# Patient Record
Sex: Female | Born: 1975 | Hispanic: No | Marital: Married | Smoking: Never smoker
Health system: Southern US, Community
[De-identification: ages and names within clinical notes are randomized; demographics above are authoritative.]

---

## 2004-07-20 ENCOUNTER — Ambulatory Visit: Payer: Self-pay | Admitting: Urology

## 2004-10-13 ENCOUNTER — Inpatient Hospital Stay: Payer: Self-pay

## 2004-10-13 ENCOUNTER — Observation Stay: Payer: Self-pay

## 2012-03-13 ENCOUNTER — Ambulatory Visit: Payer: Self-pay | Admitting: Obstetrics and Gynecology

## 2014-12-23 ENCOUNTER — Other Ambulatory Visit: Payer: Self-pay | Admitting: Obstetrics and Gynecology

## 2014-12-23 DIAGNOSIS — Z1231 Encounter for screening mammogram for malignant neoplasm of breast: Secondary | ICD-10-CM

## 2015-01-01 ENCOUNTER — Ambulatory Visit
Admission: RE | Admit: 2015-01-01 | Discharge: 2015-01-01 | Disposition: A | Discharge status: Home / Self Care | Payer: BLUE CROSS/BLUE SHIELD | Source: Ambulatory Visit | Attending: Obstetrics and Gynecology | Admitting: Obstetrics and Gynecology

## 2015-01-01 DIAGNOSIS — Z1231 Encounter for screening mammogram for malignant neoplasm of breast: Secondary | ICD-10-CM | POA: Insufficient documentation

## 2015-12-07 ENCOUNTER — Other Ambulatory Visit: Payer: Self-pay | Admitting: Obstetrics and Gynecology

## 2015-12-07 DIAGNOSIS — Z1231 Encounter for screening mammogram for malignant neoplasm of breast: Secondary | ICD-10-CM

## 2016-01-17 ENCOUNTER — Ambulatory Visit
Admission: RE | Admit: 2016-01-17 | Discharge: 2016-01-17 | Disposition: A | Discharge status: Home / Self Care | Payer: Managed Care, Other (non HMO) | Source: Ambulatory Visit | Attending: Obstetrics and Gynecology | Admitting: Obstetrics and Gynecology

## 2016-01-17 DIAGNOSIS — Z1231 Encounter for screening mammogram for malignant neoplasm of breast: Secondary | ICD-10-CM | POA: Insufficient documentation

## 2016-12-14 ENCOUNTER — Other Ambulatory Visit: Payer: Self-pay | Admitting: Certified Nurse Midwife

## 2016-12-14 DIAGNOSIS — Z1231 Encounter for screening mammogram for malignant neoplasm of breast: Secondary | ICD-10-CM

## 2017-04-16 ENCOUNTER — Ambulatory Visit
Admission: RE | Admit: 2017-04-16 | Discharge: 2017-04-16 | Disposition: A | Discharge status: Home / Self Care | Payer: 59 | Source: Ambulatory Visit | Attending: Certified Nurse Midwife | Admitting: Certified Nurse Midwife

## 2017-04-16 DIAGNOSIS — Z1231 Encounter for screening mammogram for malignant neoplasm of breast: Secondary | ICD-10-CM

## 2017-11-25 ENCOUNTER — Other Ambulatory Visit: Payer: Self-pay

## 2017-11-25 ENCOUNTER — Emergency Department
Admission: EM | Admit: 2017-11-25 | Discharge: 2017-11-25 | Disposition: A | Discharge status: Home / Self Care | Payer: Commercial Managed Care - HMO | Attending: Emergency Medicine | Admitting: Emergency Medicine

## 2017-11-25 ENCOUNTER — Emergency Department: Payer: Commercial Managed Care - HMO

## 2017-11-25 DIAGNOSIS — O200 Threatened abortion: Secondary | ICD-10-CM | POA: Diagnosis not present

## 2017-11-25 DIAGNOSIS — Z3A01 Less than 8 weeks gestation of pregnancy: Secondary | ICD-10-CM | POA: Insufficient documentation

## 2017-11-25 DIAGNOSIS — O9989 Other specified diseases and conditions complicating pregnancy, childbirth and the puerperium: Secondary | ICD-10-CM | POA: Diagnosis present

## 2017-11-25 LAB — CBC WITH DIFFERENTIAL/PLATELET
ABS IMMATURE GRANULOCYTES: 0.02 10*3/uL (ref 0.00–0.07)
BASOS ABS: 0.1 10*3/uL (ref 0.0–0.1)
BASOS PCT: 1 %
Eosinophils Absolute: 0.3 10*3/uL (ref 0.0–0.5)
Eosinophils Relative: 4 %
HCT: 36.6 % (ref 36.0–46.0)
Hemoglobin: 12.8 g/dL (ref 12.0–15.0)
Immature Granulocytes: 0 %
Lymphocytes Relative: 26 %
Lymphs Abs: 1.9 10*3/uL (ref 0.7–4.0)
MCH: 29.2 pg (ref 26.0–34.0)
MCHC: 35 g/dL (ref 30.0–36.0)
MCV: 83.6 fL (ref 80.0–100.0)
MONO ABS: 0.5 10*3/uL (ref 0.1–1.0)
Monocytes Relative: 7 %
NEUTROS ABS: 4.8 10*3/uL (ref 1.7–7.7)
NRBC: 0 % (ref 0.0–0.2)
Neutrophils Relative %: 62 %
PLATELETS: 281 10*3/uL (ref 150–400)
RBC: 4.38 MIL/uL (ref 3.87–5.11)
RDW: 12.2 % (ref 11.5–15.5)
WBC: 7.6 10*3/uL (ref 4.0–10.5)

## 2017-11-25 LAB — BASIC METABOLIC PANEL
Anion gap: 10 (ref 5–15)
BUN: 13 mg/dL (ref 6–20)
CO2: 24 mmol/L (ref 22–32)
CREATININE: 0.55 mg/dL (ref 0.44–1.00)
Calcium: 9.3 mg/dL (ref 8.9–10.3)
Chloride: 103 mmol/L (ref 98–111)
GFR calc Af Amer: >60 mL/min (ref >60)
Glucose, Bld: 144 mg/dL — ABNORMAL HIGH (ref 70–99)
Potassium: 3.2 mmol/L — ABNORMAL LOW (ref 3.5–5.1)
Sodium: 137 mmol/L (ref 135–145)

## 2017-11-25 LAB — ABO/RH: ABO/RH(D): B POS

## 2017-11-25 LAB — HCG, QUANTITATIVE, PREGNANCY: HCG, BETA CHAIN, QUANT, S: 12947 m[IU]/mL — AB (ref <5)

## 2017-11-25 LAB — POCT PREGNANCY, URINE: Preg Test, Ur: POSITIVE — AB

## 2017-11-25 NOTE — ED Provider Notes (Signed)
[facility] Emergency Department Provider Note   ____________________________________________    I have reviewed the triage vital signs and the nursing notes.   HISTORY  Chief Complaint Pregnant vaginal bleeding    HPI Alexandra Greene is a 42 y.o. female who reports that she is about 6 to [redacted] weeks pregnant and is having lower abdominal discomfort as well as mild vaginal bleeding for approximately 1 week.  She denies nausea or vomiting.  Spoke to her doctor who recommended she be seen in the emergency department because of continued bleeding.  Did have a beta checked on Friday but does not know the results yet.  This is her second pregnancy, she has a 78 year old son.  History reviewed. No pertinent past medical history.  There are no active problems to display for this patient.   History reviewed. No pertinent surgical history.  Prior to Admission medications   Not on File     Allergies Patient has no known allergies.  Family History  Problem Relation Age of Onset  . Breast cancer Neg Hx     Social History Social History   Tobacco Use  . Smoking status: Never Smoker  . Smokeless tobacco: Never Used  Substance Use Topics  . Alcohol use: Never    Frequency: Never  . Drug use: Not on file    Review of Systems  Constitutional: No fever/chills Eyes: no redness ENT: No neck pain Cardiovascular: Denies chest pain. Respiratory: Denies shortness of breath. Gastrointestinal: No abdominal pain.   Genitourinary: As above Musculoskeletal: Negative for back pain. Skin: Negative for rash. Neurological: Negative for headache   ____________________________________________   PHYSICAL EXAM:  VITAL SIGNS: ED Triage Vitals  Enc Vitals Group     BP 11/25/17 1610 116/75     Pulse Rate 11/25/17 1610 94     Resp 11/25/17 1610 16     Temp 11/25/17 1610 98.5 F (36.9 C)     Temp Source 11/25/17 1610 Oral     SpO2 11/25/17 1610 100 %   Weight 11/25/17 1610 52.2 kg (115 lb)     Height 11/25/17 1610 1.549 m (5\' 1" )     Head Circumference --      Peak Flow --      Pain Score 11/25/17 1614 6     Pain Loc --      Pain Edu? --      Excl. in GC? --     Constitutional: Alert and oriented Eyes: Conjunctivae are normal.   Nose: No congestion/rhinnorhea. Mouth/Throat: Mucous membranes are moist.    Cardiovascular: Normal rate, regular rhythm. Good peripheral circulation. Respiratory: Normal respiratory effort.  No retractions.  Gastrointestinal:  No distention  Musculoskeletal:  Warm and well perfused Neurologic:  Normal speech and language. No gross focal neurologic deficits are appreciated.  Skin:  Skin is warm, dry and intact. No rash noted. Psychiatric: Mood and affect are normal. Speech and behavior are normal.  ____________________________________________   LABS (all labs ordered are listed, but only abnormal results are displayed)  Labs Reviewed  HCG, QUANTITATIVE, PREGNANCY - Abnormal; Notable for the following components:      Result Value   hCG, Beta Chain, Quant, S 12,947 (*)    All other components within normal limits  BASIC METABOLIC PANEL - Abnormal; Notable for the following components:   Potassium 3.2 (*)    Glucose, Bld 144 (*)    All other components within normal limits  POCT PREGNANCY, URINE -  Abnormal; Notable for the following components:   Preg Test, Ur POSITIVE (*)    All other components within normal limits  CBC WITH DIFFERENTIAL/PLATELET  POC URINE PREG, ED  ABO/RH   ____________________________________________  EKG  None ____________________________________________  RADIOLOGY  IUP, no cardiac activity concerning for nonviable pregnancy ____________________________________________   PROCEDURES  Procedure(s) performed: No  Procedures   Critical Care performed: No ____________________________________________   INITIAL IMPRESSION / ASSESSMENT AND PLAN / ED  COURSE  Pertinent labs & imaging results that were available during my care of the patient were reviewed by me and considered in my medical decision making (see chart for details).  Patient is Rh+.  Discussed ultrasound results with her, certainly concerning for possible failed pregnancy however not definitive.  She will need repeat ultrasound in 10 to 14 days, she will call her physician tomorrow.    ____________________________________________   FINAL CLINICAL IMPRESSION(S) / ED DIAGNOSES  Final diagnoses:  Threatened miscarriage        Note:  This document was prepared using Dragon voice recognition software and may include unintentional dictation errors.    Jene EveryKinner, Stevenson Windmiller, MD 11/25/17 2029

## 2017-11-25 NOTE — Discharge Instructions (Signed)
Your ultrasound did show an IUP but did not show cardiac activity yet, a repeat ultrasound is necessary in 10 to 14 days

## 2017-11-25 NOTE — ED Triage Notes (Signed)
Arrives with c/o vaginal bleeding x 1 week.  Today, states bleeding persists, but worsens and lower abdominal pain and lower back pain.  G2 P1.  LMP:  09/26/2017.  [facility]

## 2017-11-26 ENCOUNTER — Other Ambulatory Visit: Payer: Self-pay | Admitting: Family Medicine

## 2017-11-26 DIAGNOSIS — O209 Hemorrhage in early pregnancy, unspecified: Secondary | ICD-10-CM

## 2017-12-10 ENCOUNTER — Ambulatory Visit: Admission: RE | Admit: 2017-12-10 | Payer: Self-pay | Source: Ambulatory Visit

## 2018-04-23 ENCOUNTER — Other Ambulatory Visit: Payer: Self-pay | Admitting: Certified Nurse Midwife

## 2018-04-23 DIAGNOSIS — Z1231 Encounter for screening mammogram for malignant neoplasm of breast: Secondary | ICD-10-CM

## 2018-06-19 ENCOUNTER — Ambulatory Visit
Admission: RE | Admit: 2018-06-19 | Discharge: 2018-06-19 | Disposition: A | Discharge status: Home / Self Care | Payer: BC Managed Care – PPO | Source: Ambulatory Visit | Attending: Certified Nurse Midwife | Admitting: Certified Nurse Midwife

## 2018-06-19 ENCOUNTER — Other Ambulatory Visit: Payer: Self-pay

## 2018-06-19 DIAGNOSIS — Z1231 Encounter for screening mammogram for malignant neoplasm of breast: Secondary | ICD-10-CM | POA: Insufficient documentation

## 2018-06-20 ENCOUNTER — Other Ambulatory Visit: Payer: Self-pay | Admitting: Certified Nurse Midwife

## 2018-06-20 DIAGNOSIS — R928 Other abnormal and inconclusive findings on diagnostic imaging of breast: Secondary | ICD-10-CM

## 2018-06-20 DIAGNOSIS — N6310 Unspecified lump in the right breast, unspecified quadrant: Secondary | ICD-10-CM

## 2018-07-01 ENCOUNTER — Ambulatory Visit
Admission: RE | Admit: 2018-07-01 | Discharge: 2018-07-01 | Disposition: A | Discharge status: Home / Self Care | Payer: BC Managed Care – PPO | Source: Ambulatory Visit | Attending: Certified Nurse Midwife | Admitting: Certified Nurse Midwife

## 2018-07-01 ENCOUNTER — Other Ambulatory Visit: Payer: Self-pay

## 2018-07-01 DIAGNOSIS — R928 Other abnormal and inconclusive findings on diagnostic imaging of breast: Secondary | ICD-10-CM | POA: Diagnosis present

## 2018-07-01 DIAGNOSIS — N6310 Unspecified lump in the right breast, unspecified quadrant: Secondary | ICD-10-CM | POA: Diagnosis present

## 2018-07-08 ENCOUNTER — Ambulatory Visit: Payer: BC Managed Care – PPO

## 2019-05-27 ENCOUNTER — Other Ambulatory Visit: Payer: Self-pay | Admitting: Certified Nurse Midwife

## 2019-05-27 DIAGNOSIS — Z1231 Encounter for screening mammogram for malignant neoplasm of breast: Secondary | ICD-10-CM

## 2019-07-02 ENCOUNTER — Ambulatory Visit
Admission: RE | Admit: 2019-07-02 | Discharge: 2019-07-02 | Disposition: A | Discharge status: Home / Self Care | Payer: BC Managed Care – PPO | Source: Ambulatory Visit | Attending: Certified Nurse Midwife | Admitting: Certified Nurse Midwife

## 2019-07-02 DIAGNOSIS — Z1231 Encounter for screening mammogram for malignant neoplasm of breast: Secondary | ICD-10-CM | POA: Diagnosis not present

## 2019-07-08 ENCOUNTER — Other Ambulatory Visit: Payer: Self-pay | Admitting: Certified Nurse Midwife

## 2019-07-08 DIAGNOSIS — R928 Other abnormal and inconclusive findings on diagnostic imaging of breast: Secondary | ICD-10-CM

## 2019-07-09 ENCOUNTER — Other Ambulatory Visit: Payer: Self-pay | Admitting: Certified Nurse Midwife

## 2019-07-09 DIAGNOSIS — N6489 Other specified disorders of breast: Secondary | ICD-10-CM

## 2019-07-09 DIAGNOSIS — R928 Other abnormal and inconclusive findings on diagnostic imaging of breast: Secondary | ICD-10-CM

## 2019-07-28 ENCOUNTER — Ambulatory Visit
Admission: RE | Admit: 2019-07-28 | Discharge: 2019-07-28 | Disposition: A | Discharge status: Home / Self Care | Payer: BC Managed Care – PPO | Source: Ambulatory Visit | Attending: Certified Nurse Midwife | Admitting: Certified Nurse Midwife

## 2019-07-28 DIAGNOSIS — N6489 Other specified disorders of breast: Secondary | ICD-10-CM

## 2019-07-28 DIAGNOSIS — R928 Other abnormal and inconclusive findings on diagnostic imaging of breast: Secondary | ICD-10-CM

## 2020-06-30 ENCOUNTER — Other Ambulatory Visit: Payer: Self-pay | Admitting: Certified Nurse Midwife

## 2020-06-30 DIAGNOSIS — Z1231 Encounter for screening mammogram for malignant neoplasm of breast: Secondary | ICD-10-CM

## 2020-07-29 ENCOUNTER — Ambulatory Visit
Admission: RE | Admit: 2020-07-29 | Discharge: 2020-07-29 | Disposition: A | Discharge status: Home / Self Care | Payer: BC Managed Care – PPO | Source: Ambulatory Visit | Attending: Certified Nurse Midwife | Admitting: Certified Nurse Midwife

## 2020-07-29 ENCOUNTER — Other Ambulatory Visit: Payer: Self-pay

## 2020-07-29 DIAGNOSIS — Z1231 Encounter for screening mammogram for malignant neoplasm of breast: Secondary | ICD-10-CM | POA: Diagnosis present

## 2020-10-25 ENCOUNTER — Other Ambulatory Visit: Payer: Self-pay | Admitting: Certified Nurse Midwife

## 2020-10-25 DIAGNOSIS — N644 Mastodynia: Secondary | ICD-10-CM

## 2020-10-26 ENCOUNTER — Other Ambulatory Visit: Payer: Self-pay | Admitting: Certified Nurse Midwife

## 2020-10-26 DIAGNOSIS — N644 Mastodynia: Secondary | ICD-10-CM

## 2020-11-05 ENCOUNTER — Ambulatory Visit
Admission: RE | Admit: 2020-11-05 | Discharge: 2020-11-05 | Disposition: A | Discharge status: Home / Self Care | Payer: BC Managed Care – PPO | Source: Ambulatory Visit | Attending: Certified Nurse Midwife | Admitting: Certified Nurse Midwife

## 2020-11-05 ENCOUNTER — Other Ambulatory Visit: Payer: Self-pay

## 2020-11-05 DIAGNOSIS — N644 Mastodynia: Secondary | ICD-10-CM | POA: Diagnosis not present

## 2021-06-08 ENCOUNTER — Other Ambulatory Visit: Payer: Self-pay | Admitting: Certified Nurse Midwife

## 2021-06-08 DIAGNOSIS — Z1231 Encounter for screening mammogram for malignant neoplasm of breast: Secondary | ICD-10-CM

## 2021-08-01 ENCOUNTER — Ambulatory Visit
Admission: RE | Admit: 2021-08-01 | Discharge: 2021-08-01 | Disposition: A | Discharge status: Home / Self Care | Payer: BC Managed Care – PPO | Source: Ambulatory Visit | Attending: Certified Nurse Midwife | Admitting: Certified Nurse Midwife

## 2021-08-01 DIAGNOSIS — Z1231 Encounter for screening mammogram for malignant neoplasm of breast: Secondary | ICD-10-CM | POA: Insufficient documentation

## 2022-06-23 ENCOUNTER — Other Ambulatory Visit: Payer: Self-pay | Admitting: Internal Medicine

## 2022-06-23 DIAGNOSIS — Z1231 Encounter for screening mammogram for malignant neoplasm of breast: Secondary | ICD-10-CM

## 2022-08-09 ENCOUNTER — Ambulatory Visit
Admission: RE | Admit: 2022-08-09 | Discharge: 2022-08-09 | Disposition: A | Discharge status: Home / Self Care | Payer: 59 | Source: Ambulatory Visit | Attending: Internal Medicine | Admitting: Internal Medicine

## 2022-08-09 DIAGNOSIS — Z1231 Encounter for screening mammogram for malignant neoplasm of breast: Secondary | ICD-10-CM | POA: Diagnosis not present

## 2023-05-08 IMAGING — MG MM DIGITAL DIAGNOSTIC UNILAT*L* W/ TOMO W/ CAD
6 series · 6 of 18 positions shown · non-contrast
Comparison: Previous exams.

CLINICAL DATA: 45-year-old female with a focal area of intermittent
pain in the lower inner left breast.

EXAM:
DIGITAL DIAGNOSTIC UNILATERAL LEFT MAMMOGRAM WITH TOMOSYNTHESIS AND
CAD; ULTRASOUND LEFT BREAST LIMITED
TECHNIQUE: Left digital diagnostic mammography and breast tomosynthesis was
performed. The images were evaluated with computer-aided detection.;
Targeted ultrasound examination of the left breast was performed.

[L CC synth-2D (1 of 2)]
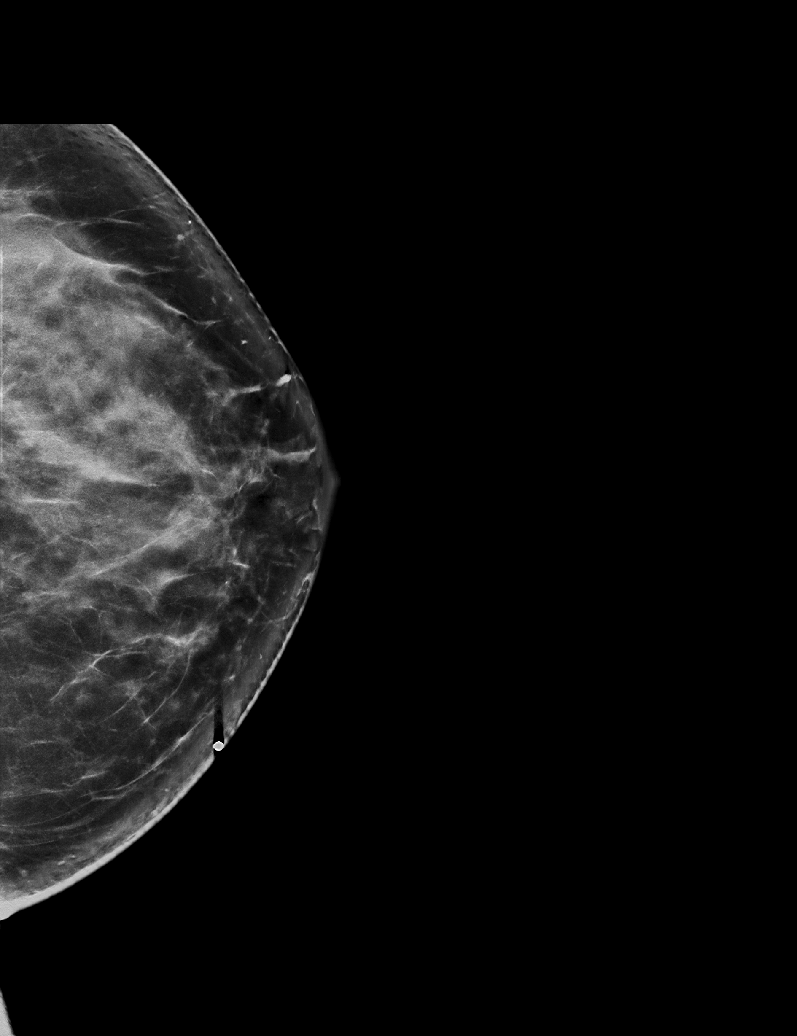

[L MLO synth-2D]
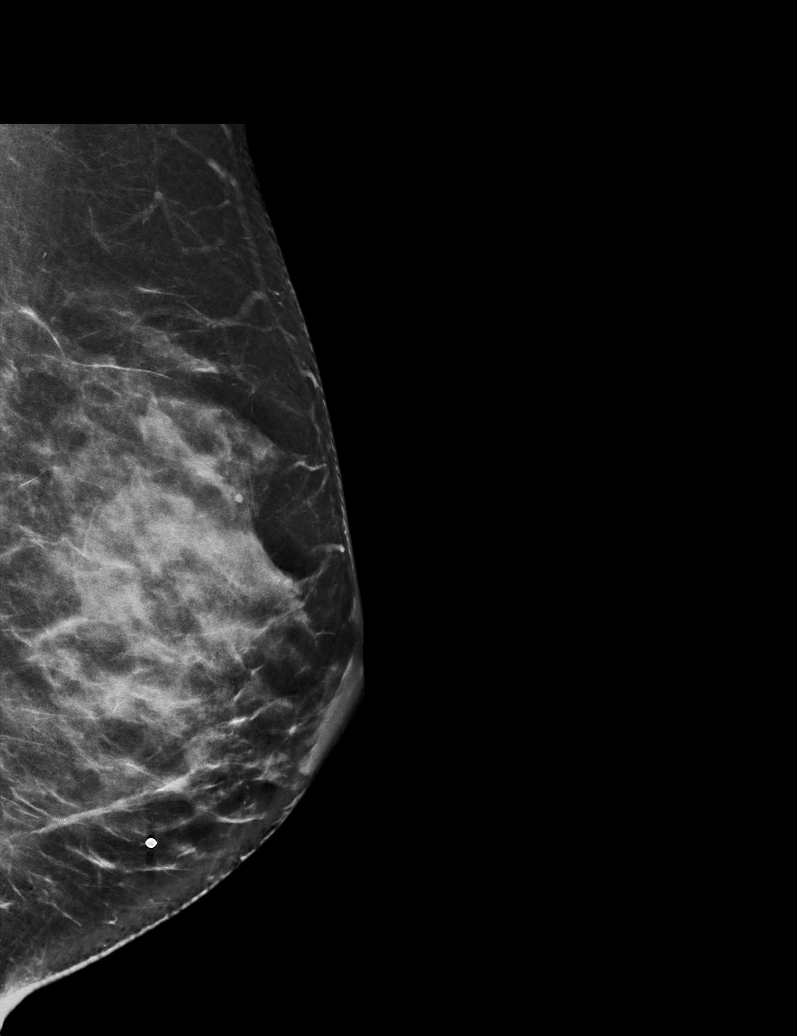

[L CC synth-2D (2 of 2)]
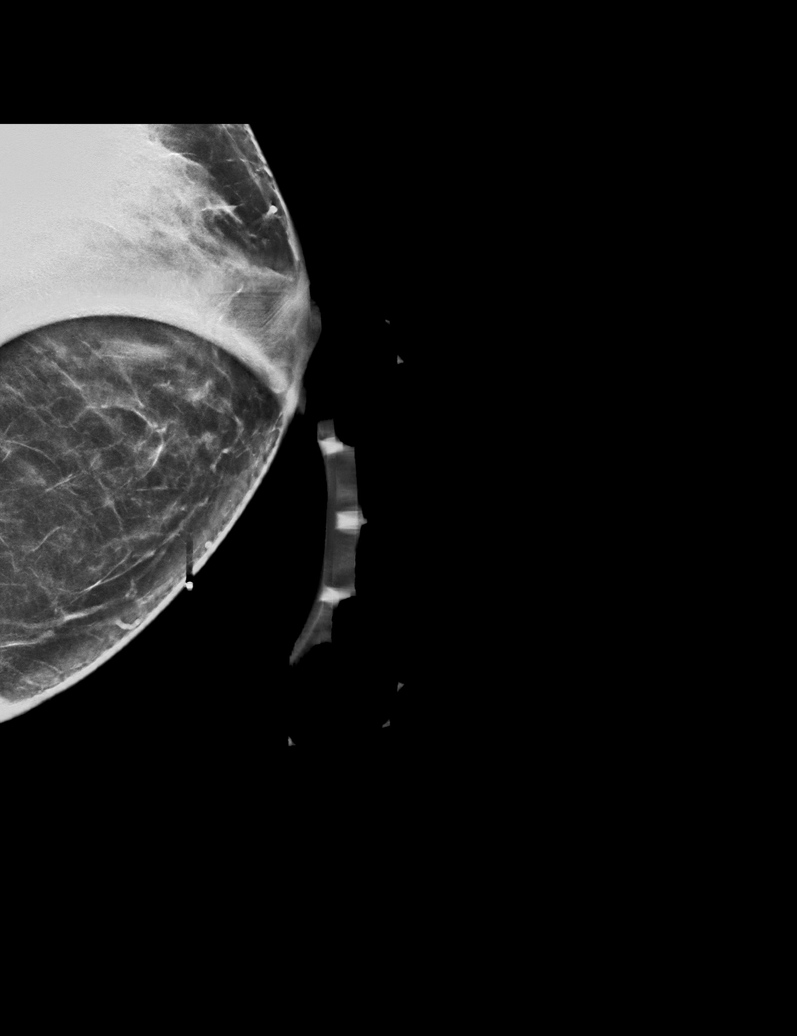

[L CC tomo (1 of 2) · tomo slice 39/77.0]
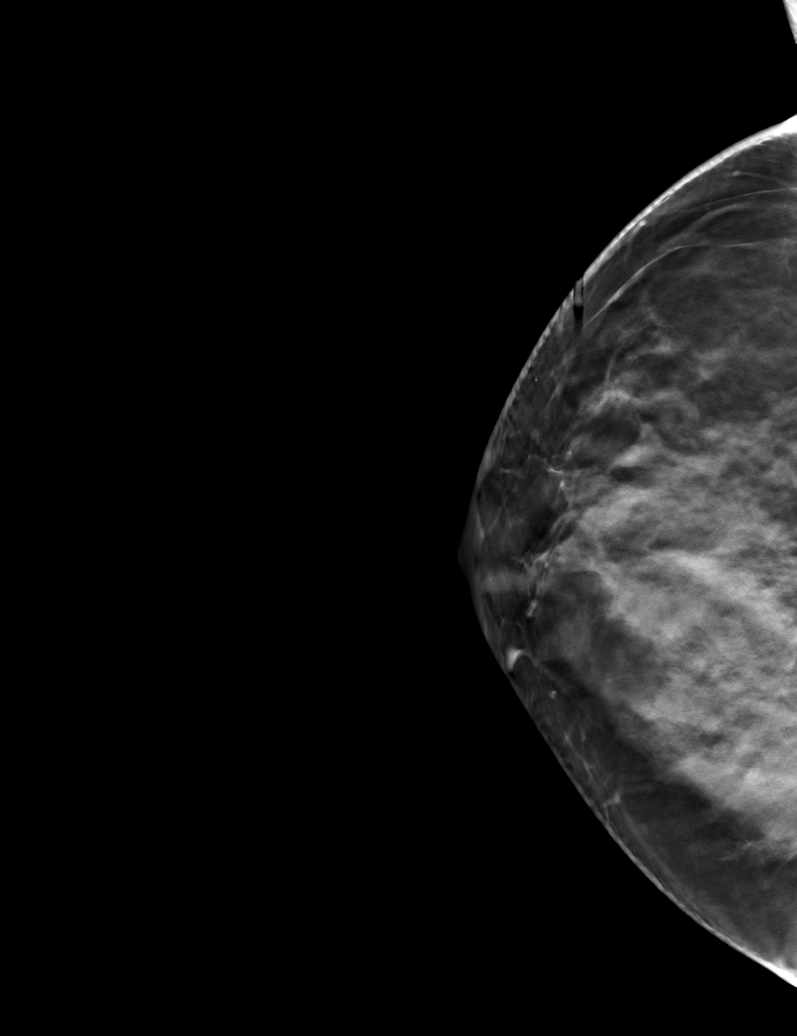

[L CC tomo (2 of 2) · tomo slice 33/65.0]
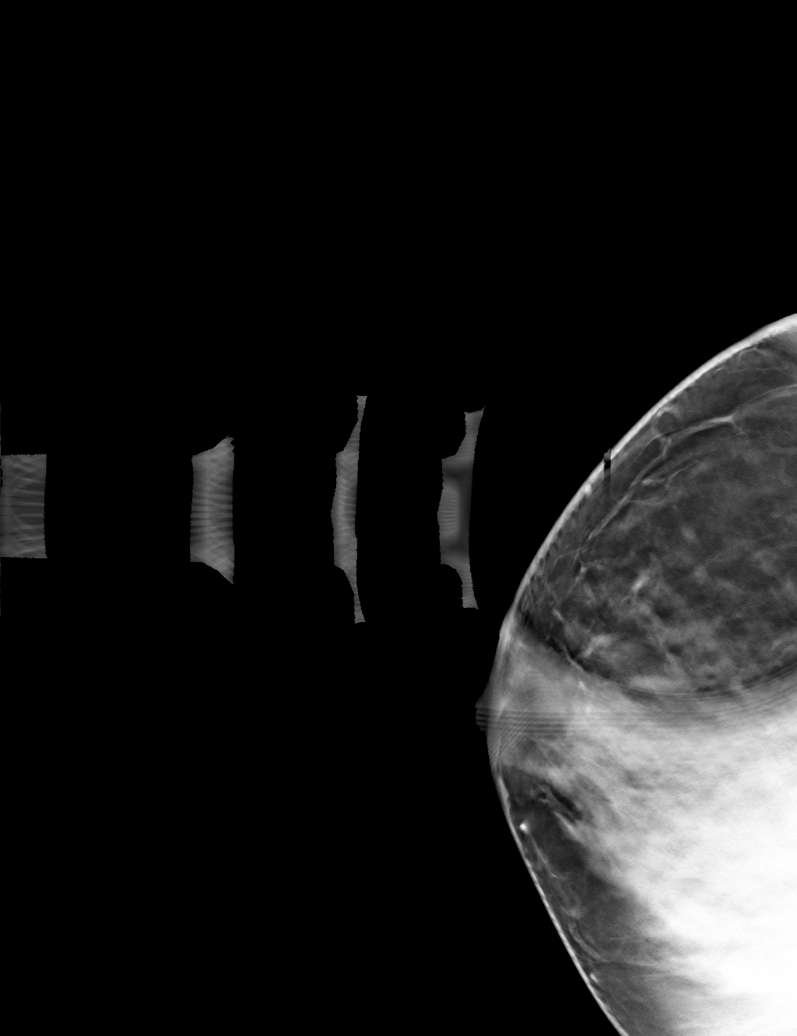

[L MLO tomo · tomo slice 39/76.0]
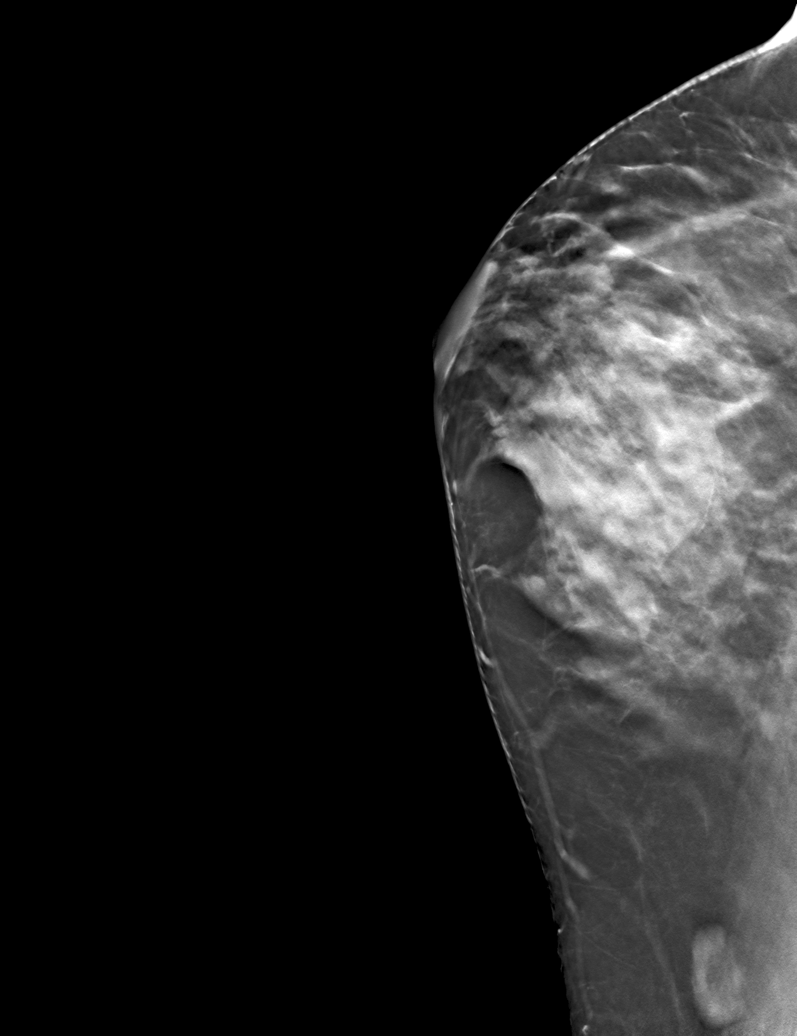

[6 of 18 positions shown; findings below may reference images not displayed]

ACR Breast Density Category d: The breast tissue is extremely dense,
which lowers the sensitivity of mammography.
FINDINGS: No suspicious masses or calcifications seen in the left breast. Spot
compression tomograms were performed over the area of focal pain in
the left breast with no definite abnormality seen.

Targeted ultrasound of the left breast was performed. No suspicious
masses or abnormality seen, only normal-appearing heterogeneous
fibroglandular tissue identified.
IMPRESSION: 1. No mammographic or sonographic abnormalities at site of pain in
the left breast.

2.  No findings of malignancy in the left breast.

RECOMMENDATION:
1. Recommend further management of left breast pain be based on
clinical assessment.

2.  Recommend annual routine screening mammography, due July 2021.

I have discussed the findings and recommendations with the patient.
If applicable, a reminder letter will be sent to the patient
regarding the next appointment.

BI-RADS CATEGORY  1: Negative.

## 2023-07-03 ENCOUNTER — Other Ambulatory Visit: Payer: Self-pay | Admitting: Internal Medicine

## 2023-07-03 DIAGNOSIS — Z1231 Encounter for screening mammogram for malignant neoplasm of breast: Secondary | ICD-10-CM

## 2023-08-10 ENCOUNTER — Encounter

## 2023-08-16 ENCOUNTER — Encounter
